# Patient Record
Sex: Female | Born: 1980 | Race: White | Hispanic: No | Marital: Married | Smoking: Never smoker
Health system: Southern US, Community
[De-identification: ages and names within clinical notes are randomized; demographics above are authoritative.]

---

## 2013-10-15 ENCOUNTER — Emergency Department (HOSPITAL_COMMUNITY)
Admission: EM | Admit: 2013-10-15 | Discharge: 2013-10-15 | Disposition: A | Discharge status: Home / Self Care | Payer: Self-pay | Attending: Emergency Medicine | Admitting: Emergency Medicine

## 2013-10-15 ENCOUNTER — Emergency Department (HOSPITAL_COMMUNITY): Payer: Self-pay

## 2013-10-15 ENCOUNTER — Encounter (HOSPITAL_COMMUNITY): Payer: Self-pay | Admitting: Emergency Medicine

## 2013-10-15 DIAGNOSIS — N83209 Unspecified ovarian cyst, unspecified side: Secondary | ICD-10-CM | POA: Insufficient documentation

## 2013-10-15 DIAGNOSIS — R109 Unspecified abdominal pain: Secondary | ICD-10-CM | POA: Insufficient documentation

## 2013-10-15 DIAGNOSIS — Z3202 Encounter for pregnancy test, result negative: Secondary | ICD-10-CM | POA: Insufficient documentation

## 2013-10-15 LAB — CBC WITH DIFFERENTIAL/PLATELET
Basophils Absolute: 0 10*3/uL (ref 0.0–0.1)
Basophils Relative: 0 % (ref 0–1)
Eosinophils Absolute: 0 10*3/uL (ref 0.0–0.7)
Eosinophils Relative: 0 % (ref 0–5)
MCH: 29.9 pg (ref 26.0–34.0)
MCHC: 33.6 g/dL (ref 30.0–36.0)
MCV: 88.8 fL (ref 78.0–100.0)
Neutrophils Relative %: 76 % (ref 43–77)
Platelets: 550 10*3/uL — ABNORMAL HIGH (ref 150–400)
RBC: 3.85 MIL/uL — ABNORMAL LOW (ref 3.87–5.11)
RDW: 13 % (ref 11.5–15.5)

## 2013-10-15 LAB — WET PREP, GENITAL
Trich, Wet Prep: NONE SEEN
Yeast Wet Prep HPF POC: NONE SEEN

## 2013-10-15 LAB — URINALYSIS W MICROSCOPIC + REFLEX CULTURE
Bilirubin Urine: NEGATIVE
Glucose, UA: NEGATIVE mg/dL
Ketones, ur: NEGATIVE mg/dL
Leukocytes, UA: NEGATIVE
Protein, ur: NEGATIVE mg/dL
Urobilinogen, UA: 0.2 mg/dL (ref 0.0–1.0)

## 2013-10-15 LAB — POCT I-STAT, CHEM 8
Creatinine, Ser: 0.8 mg/dL (ref 0.50–1.10)
Glucose, Bld: 96 mg/dL (ref 70–99)
HCT: 37 % (ref 36.0–46.0)
Hemoglobin: 12.6 g/dL (ref 12.0–15.0)
Potassium: 4.6 mEq/L (ref 3.5–5.1)
Sodium: 142 mEq/L (ref 135–145)
TCO2: 28 mmol/L (ref 0–100)

## 2013-10-15 MED ORDER — HYDROCODONE-ACETAMINOPHEN 5-325 MG PO TABS: 1.0000 | ORAL_TABLET | Freq: Four times a day (QID) | ORAL | Status: AC | PRN

## 2013-10-15 NOTE — Discharge Instructions (Signed)
Your ultrasound showed an ovarian cyst, most likely hemorrhagic ovarian cyst on the right ovary. Take ibuprofen or tylenol for pain. Take norco as prescribed as needed for severe pain. Please follow up with your OB/GYN doctor for further treatment and evaluation. It is important to have another ultrasound done in 6-12 weeks for re evaluation.   Ovarian Cyst The ovaries are small organs that are on each side of the uterus. The ovaries are the organs that produce the female hormones, estrogen and progesterone. An ovarian cyst is a sac filled with fluid that can vary in its size. It is normal for a small cyst to form in women who are in the childbearing age and who have menstrual periods. This type of cyst is called a follicle cyst that becomes an ovulation cyst (corpus luteum cyst) after it produces the women's egg. It later goes away on its own if the woman does not become pregnant. There are other kinds of ovarian cysts that may cause problems and may need to be treated. The most serious problem is a cyst with cancer. It should be noted that menopausal women who have an ovarian cyst are at a higher risk of it being a cancer cyst. They should be evaluated very quickly, thoroughly and followed closely. This is especially true in menopausal women because of the high rate of ovarian cancer in women in menopause. CAUSES AND TYPES OF OVARIAN CYSTS:  FUNCTIONAL CYST: The follicle/corpus luteum cyst is a functional cyst that occurs every month during ovulation with the menstrual cycle. They go away with the next menstrual cycle if the woman does not get pregnant. Usually, there are no symptoms with a functional cyst.  ENDOMETRIOMA CYST: This cyst develops from the lining of the uterus tissue. This cyst gets in or on the ovary. It grows every month from the bleeding during the menstrual period. It is also called a "chocolate cyst" because it becomes filled with blood that turns brown. This cyst can cause pain in the  lower abdomen during intercourse and with your menstrual period.  CYSTADENOMA CYST: This cyst develops from the cells on the outside of the ovary. They usually are not cancerous. They can get very big and cause lower abdomen pain and pain with intercourse. This type of cyst can twist on itself, cut off its blood supply and cause severe pain. It also can easily rupture and cause a lot of pain.  DERMOID CYST: This type of cyst is sometimes found in both ovaries. They are found to have different kinds of body tissue in the cyst. The tissue includes skin, teeth, hair, and/or cartilage. They usually do not have symptoms unless they get very big. Dermoid cysts are rarely cancerous.  POLYCYSTIC OVARY: This is a rare condition with hormone problems that produces many small cysts on both ovaries. The cysts are follicle-like cysts that never produce an egg and become a corpus luteum. It can cause an increase in body weight, infertility, acne, increase in body and facial hair and lack of menstrual periods or rare menstrual periods. Many women with this problem develop type 2 diabetes. The exact cause of this problem is unknown. A polycystic ovary is rarely cancerous.  THECA LUTEIN CYST: Occurs when too much hormone (human chorionic gonadotropin) is produced and over-stimulates the ovaries to produce an egg. They are frequently seen when doctors stimulate the ovaries for invitro-fertilization (test tube babies).  LUTEOMA CYST: This cyst is seen during pregnancy. Rarely it can cause an obstruction to  the birth canal during labor and delivery. They usually go away after delivery. SYMPTOMS   Pelvic pain or pressure.  Pain during sexual intercourse.  Increasing girth (swelling) of the abdomen.  Abnormal menstrual periods.  Increasing pain with menstrual periods.  You stop having menstrual periods and you are not pregnant. DIAGNOSIS  The diagnosis can be made during:  Routine or annual pelvic examination  (common).  Ultrasound.  X-ray of the pelvis.  CT Scan.  MRI.  Blood tests. TREATMENT   Treatment may only be to follow the cyst monthly for 2 to 3 months with your caregiver. Many go away on their own, especially functional cysts.  May be aspirated (drained) with a long needle with ultrasound, or by laparoscopy (inserting a tube into the pelvis through a small incision).  The whole cyst can be removed by laparoscopy.  Sometimes the cyst may need to be removed through an incision in the lower abdomen.  Hormone treatment is sometimes used to help dissolve certain cysts.  Birth control pills are sometimes used to help dissolve certain cysts. HOME CARE INSTRUCTIONS  Follow your caregiver's advice regarding:  Medicine.  Follow up visits to evaluate and treat the cyst.  You may need to come back or make an appointment with another caregiver, to find the exact cause of your cyst, if your caregiver is not a gynecologist.  Get your yearly and recommended pelvic examinations and Pap tests.  Let your caregiver know if you have had an ovarian cyst in the past. SEEK MEDICAL CARE IF:   Your periods are late, irregular, they stop, or are painful.  Your stomach (abdomen) or pelvic pain does not go away.  Your stomach becomes larger or swollen.  You have pressure on your bladder or trouble emptying your bladder completely.  You have painful sexual intercourse.  You have feelings of fullness, pressure, or discomfort in your stomach.  You lose weight for no apparent reason.  You feel generally ill.  You become constipated.  You lose your appetite.  You develop acne.  You have an increase in body and facial hair.  You are gaining weight, without changing your exercise and eating habits.  You think you are pregnant. SEEK IMMEDIATE MEDICAL CARE IF:   You have increasing abdominal pain.  You feel sick to your stomach (nausea) and/or vomit.  You develop a fever that  comes on suddenly.  You develop abdominal pain during a bowel movement.  Your menstrual periods become heavier than usual. Document Released: 11/03/2005 Document Revised: 01/26/2012 Document Reviewed: 09/06/2009 [facility] Patient Information 2014 [place], [place].

## 2013-10-15 NOTE — ED Notes (Signed)
Pt is here with BR vaginal bleeding since Thursday after having IUD removed.   Pt had vaginal bleeding prior to IUD removed and that is why she had it removed.  Pt states she has been having lower abdominal/pelvic pain.  Since IUD removal continues to bleed and reports fatigue and weakness.

## 2013-10-15 NOTE — ED Provider Notes (Signed)
CSN: [id number]     Arrival date & time 10/15/13  1048 History   First MD Initiated Contact with Patient 10/15/13 1105     Chief Complaint  Patient presents with  . Vaginal Bleeding   (Consider location/radiation/quality/duration/timing/severity/associated sxs/prior Treatment) HPI Andrea Fisher is a 32 y.o. female who presents emergency department complaining of pelvic pain and vaginal bleeding. Patient states that she developed mild lower abdominal discomfort approximately 2 weeks ago. States felt like pressure. States had pressure in her bladder and with bowel movements. States her bowel movements were very thin but she was still able to go daily. States she went to her OB/GYN last week for the same complaints, states that she had pelvic exam done and they removed her copper IUD that she has had for 2 years. States that she thought this would help her symptoms. Patient reports that her symptoms have only worsened since. She states she also developed vaginal bleeding after the IUD removal. She has had bleeding for a week now. States bleeding is bright red and it is lighter than her normal period. Patient states pain is midline. States it does not radiate. Denies any fever, chills, urinary symptoms, nausea, vomiting, diarrhea. No blood in her stool. She states she had intercourse once last week and reports that she did not have any pain with intercourse. She did not take any medications for this.  History reviewed. No pertinent past medical history. Past Surgical History  Procedure Laterality Date  . Cesarean section     No family history on file. History  Substance Use Topics  . Smoking status: Never Smoker   . Smokeless tobacco: Not on file  . Alcohol Use: No   OB History   Grav Para Term Preterm Abortions TAB SAB Ect Mult Living                 Review of Systems  Constitutional: Negative for fever and chills.  Respiratory: Negative for cough, chest tightness and shortness of  breath.   Cardiovascular: Negative for chest pain, palpitations and leg swelling.  Gastrointestinal: Positive for abdominal pain. Negative for nausea, vomiting and diarrhea.  Genitourinary: Positive for vaginal bleeding and pelvic pain. Negative for dysuria, flank pain, vaginal discharge and vaginal pain.  Musculoskeletal: Negative for arthralgias, myalgias, neck pain and neck stiffness.  Skin: Negative for rash.  Neurological: Negative for dizziness, weakness and headaches.  All other systems reviewed and are negative.    Allergies  Review of patient's allergies indicates no known allergies.  Home Medications  No current outpatient prescriptions on file. BP 108/73  Pulse 101  Temp(Src) 98.5 F (36.9 C) (Oral)  Resp 18  Wt 115 lb 8 oz (52.39 kg)  SpO2 100% Physical Exam  Nursing note and vitals reviewed. Constitutional: She is oriented to person, place, and time. She appears well-developed and well-nourished. No distress.  HENT:  Head: Normocephalic.  Eyes: Conjunctivae are normal.  Neck: Neck supple.  Cardiovascular: Normal rate, regular rhythm and normal heart sounds.   Pulmonary/Chest: Effort normal and breath sounds normal. No respiratory distress. She has no wheezes. She has no rales.  Abdominal: Soft. Bowel sounds are normal. She exhibits no distension. There is tenderness. There is no rebound.  Suprapubic tenderness  Genitourinary:  Normal external genitalia. Blood in vaginal canal, no clots. Cervix normal. No CMT. Mild uterine and right adnexal tenderness. No masses.   Musculoskeletal: She exhibits no edema.  Neurological: She is alert and oriented to person, place, and time.  Skin: Skin is warm and dry.  Psychiatric: She has a normal mood and affect. Her behavior is normal.    ED Course  Procedures (including critical care time) Labs Review Labs Reviewed  WET PREP, GENITAL - Abnormal; Notable for the following:    Clue Cells Wet Prep HPF POC FEW (*)    WBC,  Wet Prep HPF POC MODERATE (*)    All other components within normal limits  CBC WITH DIFFERENTIAL - Abnormal; Notable for the following:    WBC 11.0 (*)    RBC 3.85 (*)    Hemoglobin 11.5 (*)    HCT 34.2 (*)    Platelets 550 (*)    Neutro Abs 8.3 (*)    All other components within normal limits  URINALYSIS W MICROSCOPIC + REFLEX CULTURE - Abnormal; Notable for the following:    APPearance CLOUDY (*)    Specific Gravity, Urine 1.004 (*)    Hgb urine dipstick LARGE (*)    All other components within normal limits  POCT I-STAT, CHEM 8 - Abnormal; Notable for the following:    BUN 5 (*)    Calcium, Ion 1.26 (*)    All other components within normal limits  GC/CHLAMYDIA PROBE AMP  POCT PREGNANCY, URINE   Imaging Review No results found.  EKG Interpretation   None       MDM   1. Ovarian cyst     Pt with pelvic pain. Vaginal bleeding on exam, otherwise cervix normal with no CMT. Labs unremarkable, UA negative. Will get US pelvis.   Pt does not want any pain medications at this time.   3:33 PM US showing right ovarian cystic lesion, most likely hemorrhagic cyst. Pt has GYN, instructed to follow up closely. She voiced understanding. Will start on ibuprofen/tylenol. norco for severe pain. Return precautions given.   Filed Vitals:   10/15/13 1058  BP: 108/73  Pulse: 101  Temp: 98.5 F (36.9 C)  TempSrc: Oral  Resp: 18  Weight: 115 lb 8 oz (52.39 kg)  SpO2: 100%     Imoni Kohen A Kydan Shanholtzer, PA-C 10/15/13 1534

## 2013-10-15 NOTE — ED Provider Notes (Signed)
  Medical screening examination/treatment/procedure(s) were performed by non-physician practitioner and as supervising physician I was immediately available for consultation/collaboration.  EKG Interpretation   None         Gerhard Munch, MD 10/15/13 1538

## 2014-10-24 IMAGING — US US PELVIS COMPLETE
1 series · 14 of 25 positions shown · non-contrast
Comparison: None

CLINICAL DATA: Vaginal bleed

EXAM:
TRANSABDOMINAL AND TRANSVAGINAL ULTRASOUND OF PELVIS
TECHNIQUE: Both transabdominal and transvaginal ultrasound examinations of the
pelvis were performed. Transabdominal technique was performed for
global imaging of the pelvis including uterus, ovaries, adnexal
regions, and pelvic cul-de-sac. It was necessary to proceed with
endovaginal exam following the transabdominal exam to visualize the
endometrium and adnexa.

[Series 1: us pelvis complete · 0.15mm/px · 54 acquisitions, 14 frames shown]
[im 1/54]
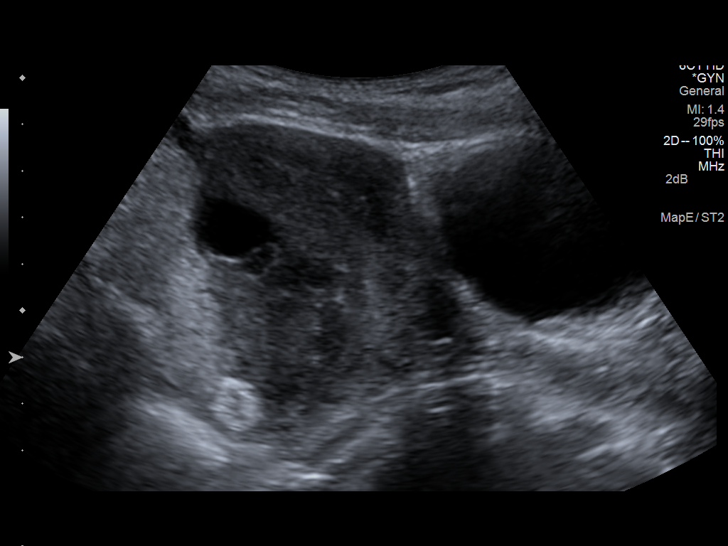
[im 5/54]
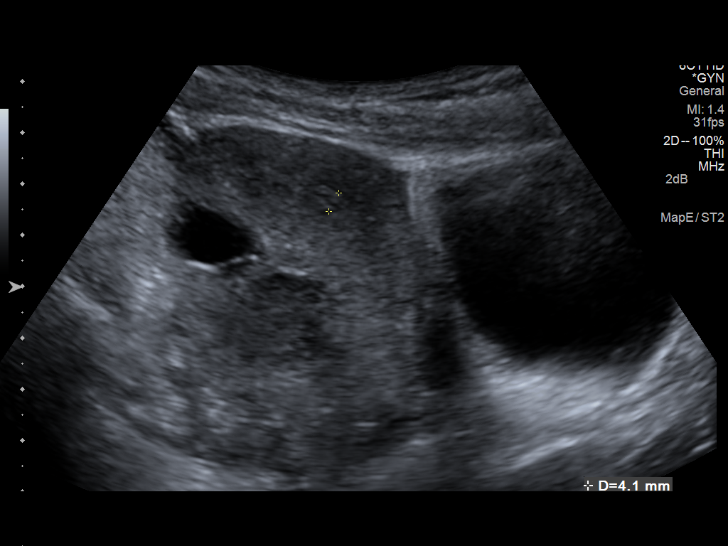
[im 9/54]
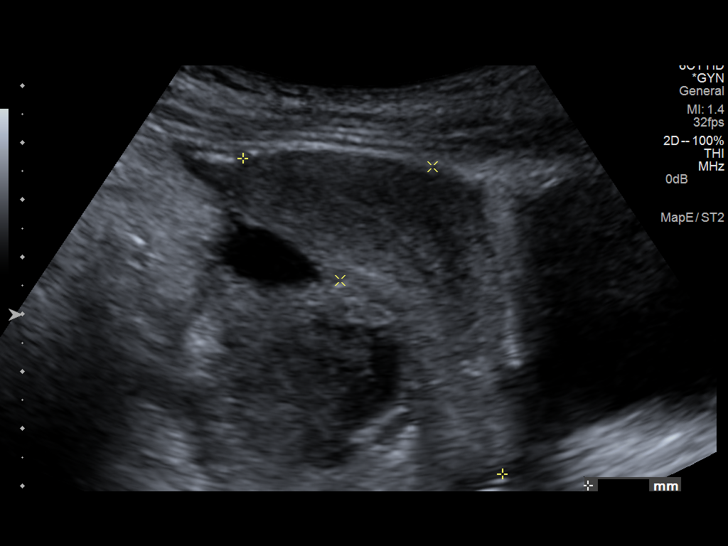
[im 14/54]
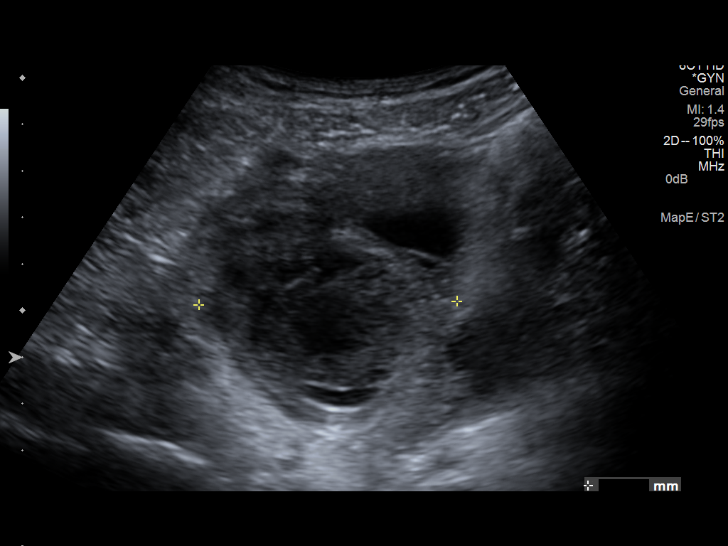
[im 18/54]
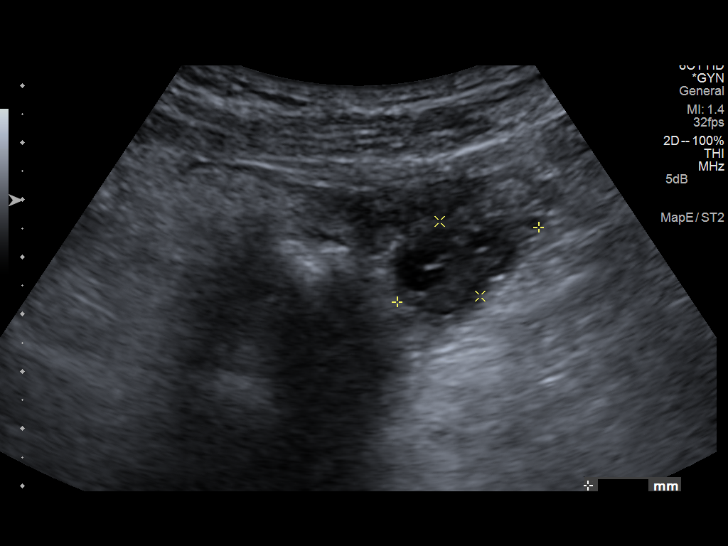
[im 20/54]
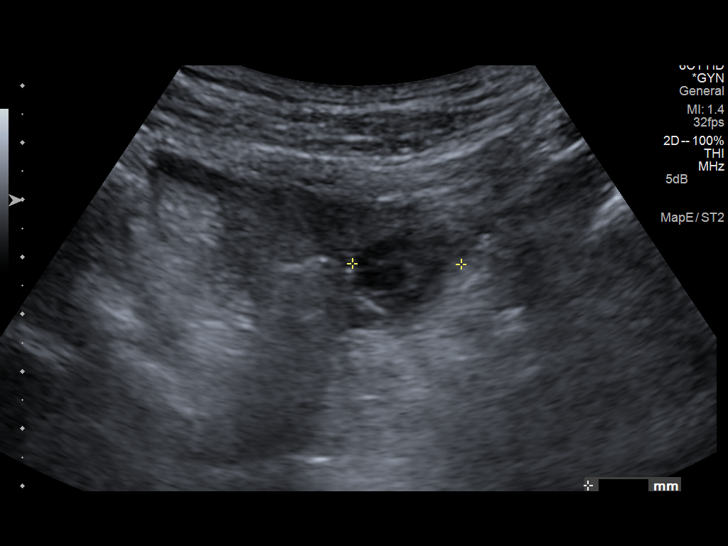
[im 25/54]
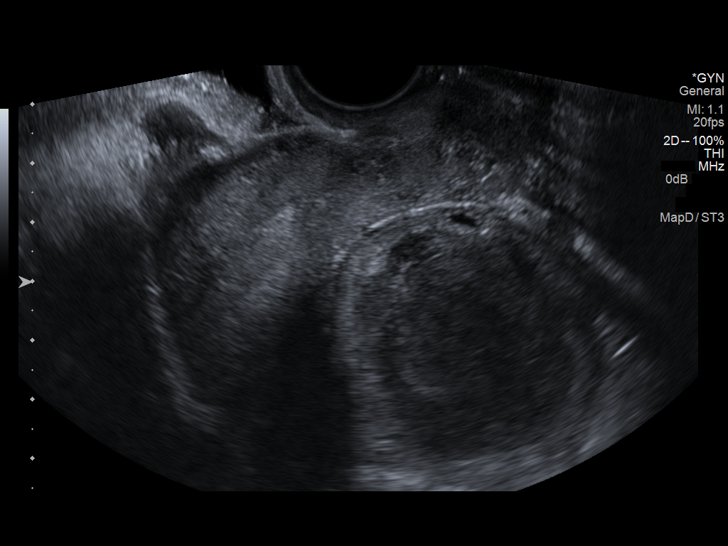
[im 29/54]
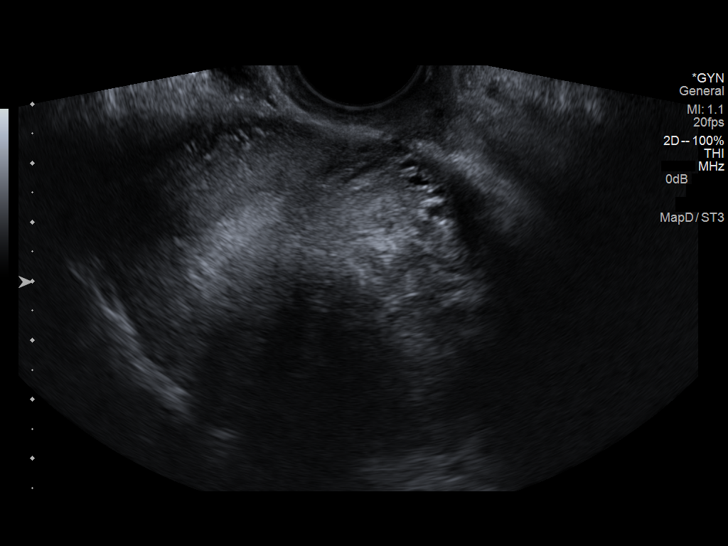
[im 34/54]
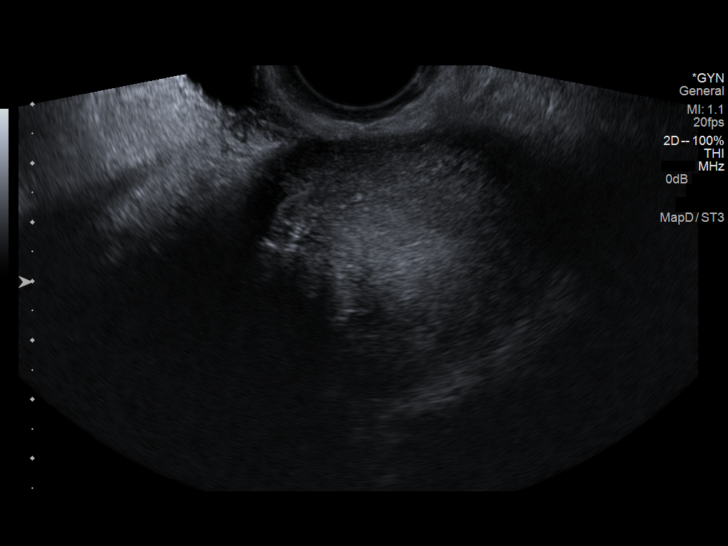
[im 36/54]
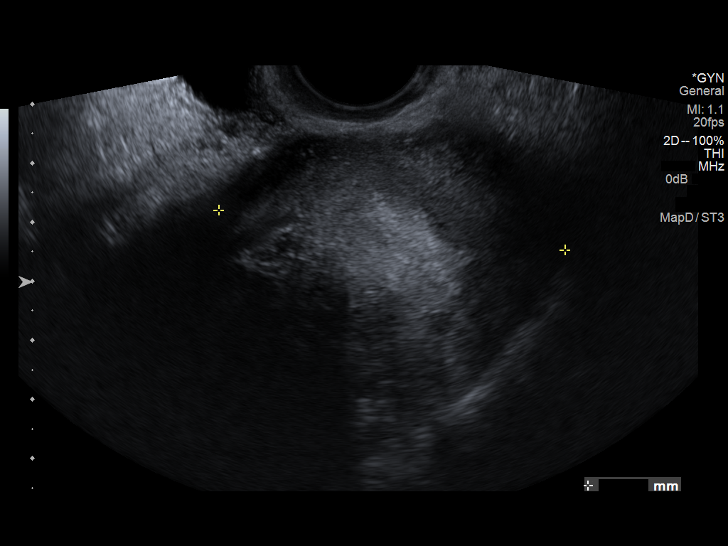
[im 40/54]
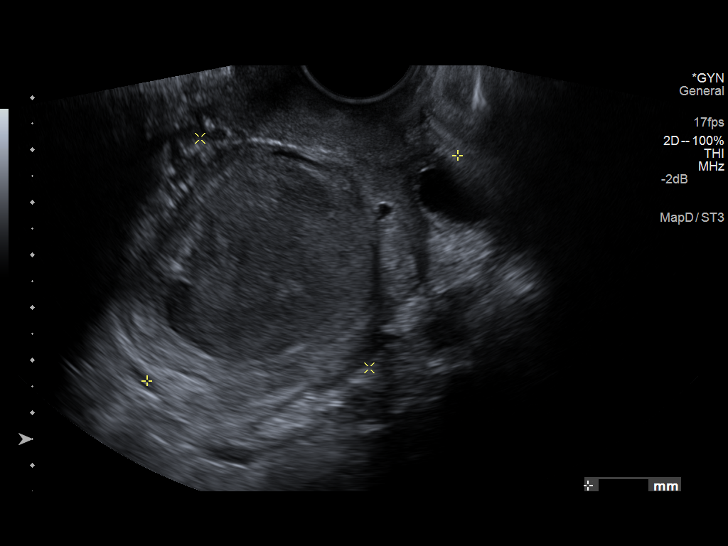
[im 45/54]
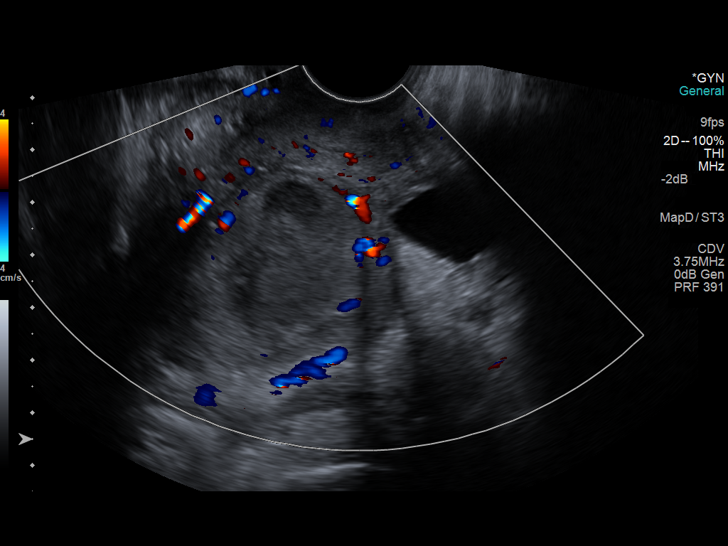
[im 49/54]
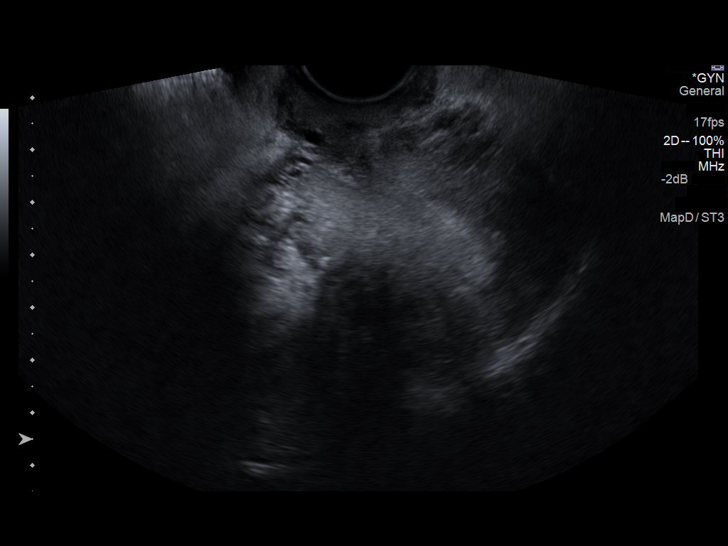
[im 54/54]
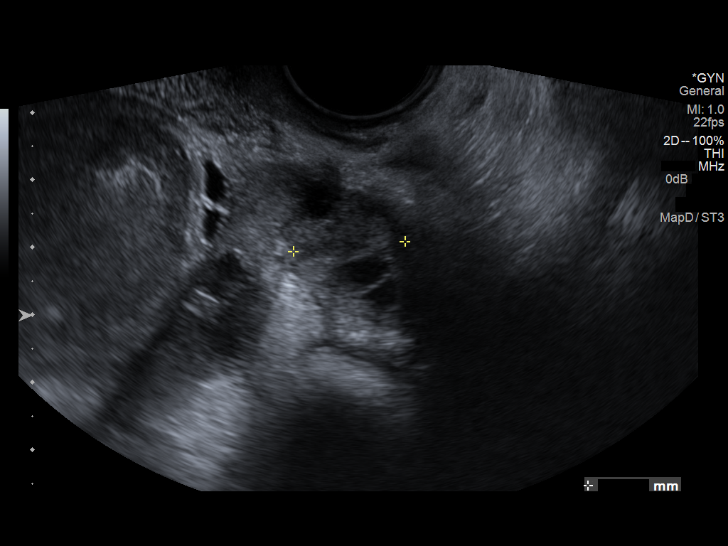

[14 of 25 positions shown; findings below may reference images not displayed]

FINDINGS: Uterus

Measurements: 7.4 x 3.0 x 5.9 cm. No fibroids or other mass
visualized.

Endometrium

Thickness: 5.8 mm in thickness and uniform. No focal abnormality
visualized.

Right ovary

Measurements: 7.3 x 5.4 x 5.8 cm. The right ovary contains a large
complex cystic lesion measuring 4.6 x 4.0 x 4.0 cm. No significant
internal vascularity. No shadowing elements. It is markedly
heterogeneous.

Left ovary

Measurements: 2.9 x 1.8 x 1.8 cm. Normal appearance/no adnexal mass.

Other findings

No free fluid.
IMPRESSION: Large complex cystic lesion in the right ovary likely represents a
hemorrhagic cyst. Followup ultrasound in 6-12 weeks is recommended
to ensure resolution.
